# Patient Record
Sex: Male | Born: 2005 | Race: Black or African American | Hispanic: No | Marital: Single | Smoking: Never smoker
Health system: Southern US, Community
[De-identification: ages and names within clinical notes are randomized; demographics above are authoritative.]

## PROBLEM LIST (undated history)

## (undated) ENCOUNTER — Ambulatory Visit (HOSPITAL_COMMUNITY): Admission: EM | Payer: Self-pay

## (undated) DIAGNOSIS — Z8489 Family history of other specified conditions: Secondary | ICD-10-CM

---
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
## 2023-02-28 ENCOUNTER — Emergency Department (HOSPITAL_COMMUNITY): Payer: Medicaid Other

## 2023-02-28 ENCOUNTER — Emergency Department (HOSPITAL_COMMUNITY)
Admission: EM | Admit: 2023-02-28 | Discharge: 2023-02-28 | Disposition: A | Discharge status: Home / Self Care | Payer: Medicaid Other | Attending: Emergency Medicine | Admitting: Emergency Medicine

## 2023-02-28 ENCOUNTER — Encounter (HOSPITAL_COMMUNITY): Payer: Self-pay

## 2023-02-28 ENCOUNTER — Other Ambulatory Visit: Payer: Self-pay

## 2023-02-28 DIAGNOSIS — S93401A Sprain of unspecified ligament of right ankle, initial encounter: Secondary | ICD-10-CM | POA: Insufficient documentation

## 2023-02-28 DIAGNOSIS — X501XXA Overexertion from prolonged static or awkward postures, initial encounter: Secondary | ICD-10-CM | POA: Diagnosis not present

## 2023-02-28 DIAGNOSIS — Y9302 Activity, running: Secondary | ICD-10-CM | POA: Insufficient documentation

## 2023-02-28 DIAGNOSIS — S99911A Unspecified injury of right ankle, initial encounter: Secondary | ICD-10-CM | POA: Diagnosis present

## 2023-02-28 MED ORDER — IBUPROFEN 200 MG PO TABS
600.0000 mg | ORAL_TABLET | Freq: Once | ORAL | Status: AC
  Administered 2023-02-28: 600 mg via ORAL
  Filled 2023-02-28: qty 3

## 2023-02-28 NOTE — ED Triage Notes (Signed)
Pt came in after running and twisting his right ankle. Pt ankle appears swollen and he rates his pain 13/10.

## 2023-02-28 NOTE — ED Provider Notes (Signed)
[place] EMERGENCY DEPARTMENT AT [facility] Provider Note   CSN: [id number] Arrival date & time: 02/28/23  1916     History No chief complaint on file.   Gregory Hunt is a 17 y.o. male.  Patient presented to the emergency department after an ankle injury.  He reports that he was running when he rolled his right ankle.  Reports no difficulty bearing weight.  Denies any notable deformity significant injury.  No prior history of any surgeries in this area.  Has not take any medication prior to arriving in the emergency department.  HPI     Home Medications Prior to Admission medications   Medication Sig Start Date End Date Taking? Authorizing Provider  acetaminophen (TYLENOL) 500 MG tablet Take 1,000 mg by mouth every 6 (six) hours as needed.    [provider]      Allergies    Patient has no known allergies.    Review of Systems   Review of Systems  Musculoskeletal:  Positive for joint swelling.  All other systems reviewed and are negative.   Physical Exam Updated Vital Signs BP (!) 159/93 (BP Location: Left Arm)   Pulse 87   Temp 98.7 F (37.1 C) (Oral)   Resp 18   SpO2 (!) 87%  Physical Exam Vitals and nursing note reviewed.  Constitutional:      General: He is not in acute distress.    Appearance: He is well-developed.  HENT:     Head: Normocephalic and atraumatic.  Eyes:     Conjunctiva/sclera: Conjunctivae normal.  Cardiovascular:     Rate and Rhythm: Normal rate and regular rhythm.     Heart sounds: No murmur heard. Pulmonary:     Effort: Pulmonary effort is normal. No respiratory distress.     Breath sounds: Normal breath sounds.  Abdominal:     Palpations: Abdomen is soft.     Tenderness: There is no abdominal tenderness.  Musculoskeletal:        General: Swelling, tenderness and signs of injury present. No deformity. Normal range of motion.     Cervical back: Neck supple.     Comments: Past range of motion and right ankle  largely at baseline, resisted range of motion deficient due to pain  Skin:    General: Skin is warm and dry.     Capillary Refill: Capillary refill takes less than 2 seconds.  Neurological:     Mental Status: He is alert.  Psychiatric:        Mood and Affect: Mood normal.     ED Results / Procedures / Treatments   Labs (all labs ordered are listed, but only abnormal results are displayed) Labs Reviewed - No data to display  EKG None  Radiology DG Ankle Complete Right  Result Date: 02/28/2023 CLINICAL DATA:  Right ankle pain EXAM: RIGHT ANKLE - COMPLETE 3+ VIEW COMPARISON:  None Available. FINDINGS: There is soft tissue swelling of the anterior and lateral ankle. There is no acute fracture or dislocation. Joint spaces are well maintained. Small sclerotic density in the distal tibial diaphysis is favored as benign, possibly a bone island. IMPRESSION: Soft tissue swelling of the anterior and lateral ankle. No acute fracture. Electronically Signed   By: Darliss Cheney M.D.   On: 02/28/2023 20:47    Procedures Procedures   Medications Ordered in ED Medications  ibuprofen (ADVIL) tablet 600 mg (600 mg Oral Given 02/28/23 2051)    ED Course/ Medical Decision Making/ A&P  Medical Decision Making Amount and/or Complexity of Data Reviewed Radiology: ordered.  Risk OTC drugs.   This patient presents to the ED for concern of ankle injury.  Differential diagnosis includes ankle sprain, ankle fracture, ankle dislocation, trimalleolar fracture   Imaging Studies ordered:  I ordered imaging studies including right ankle x-ray I independently visualized and interpreted imaging which showed soft tissue swelling but no evidence of any acute fracture or dislocation I agree with the radiologist interpretation   Medicines ordered and prescription drug management:  I ordered medication including ibuprofen for pain Reevaluation of the patient after these medicines  showed that the patient improved I have reviewed the patients home medicines and have made adjustments as needed   Problem List / ED Course:  Patient presented to the emergency department with reports of a right ankle injury.  He reports that he was running when he rolled his ankle.  He did reports that he had some difficulty bearing weight at that time.  Denies any joint instability or any numbness in this area.  No prior history of any surgeries in this area.  Notable swelling on physical exam.  Point tenderness to the lateral malleolus.  X-ray negative for any fracture.  Given patient's history, will place patient in ASO lace up brace for ankle sprain.  Vies patient that he should take over-the-counter analgesic medication such as Tylenol, ibuprofen, Aleve for pain control.  Advised patient to follow-up with primary care provider for further evaluation to ensure that patient is healing without complications.  Patient is agreeable with treatment plan verbalized understanding all return precautions.  Final Clinical Impression(s) / ED Diagnoses Final diagnoses:  Sprain of right ankle, unspecified ligament, initial encounter    Rx / DC Orders ED Discharge Orders     None         Smitty Knudsen, PA-C 02/28/23 2107    Loetta Rough, MD 03/01/23 [phone]

## 2023-02-28 NOTE — Discharge Instructions (Signed)
You were seen in the emergency department for an ankle injury. Thankfully there was no evidence of a fracture or dislocation. You sustained an ankle sprain and an ASO lace-up brace was provided. Please use this brace for the next week for added support but then begin to discontinue to use to prevent over reliance on this brace.

## 2024-06-01 ENCOUNTER — Ambulatory Visit (HOSPITAL_COMMUNITY)

## 2024-06-23 ENCOUNTER — Ambulatory Visit
Admission: RE | Admit: 2024-06-23 | Discharge: 2024-06-23 | Disposition: A | Discharge status: Home / Self Care | Payer: Self-pay | Source: Ambulatory Visit | Attending: Physician Assistant | Admitting: Physician Assistant

## 2024-06-23 ENCOUNTER — Other Ambulatory Visit: Payer: Self-pay

## 2024-06-23 VITALS — BP 168/87 | HR 78 | Temp 99.6°F | Resp 19 | Ht 72.0 in | Wt 220.0 lb

## 2024-06-23 DIAGNOSIS — Z113 Encounter for screening for infections with a predominantly sexual mode of transmission: Secondary | ICD-10-CM

## 2024-06-23 DIAGNOSIS — R300 Dysuria: Secondary | ICD-10-CM

## 2024-06-23 DIAGNOSIS — Z202 Contact with and (suspected) exposure to infections with a predominantly sexual mode of transmission: Secondary | ICD-10-CM

## 2024-06-23 LAB — POCT URINE DIPSTICK
Bilirubin, UA: NEGATIVE
Glucose, UA: NEGATIVE mg/dL
Ketones, POC UA: NEGATIVE mg/dL
Nitrite, UA: NEGATIVE
Spec Grav, UA: 1.02 (ref 1.010–1.025)
Urobilinogen, UA: 0.2 U/dL
pH, UA: 6 (ref 5.0–8.0)

## 2024-06-23 NOTE — ED Notes (Signed)
 Informed by Gregory Hunt at patient access that the pt left the facility. Pt had been triaged and provided specimens but not seen by the provider yet. I attempted x1 to call the patient for him to come back and be seen by the provider with no answer.

## 2024-06-23 NOTE — ED Notes (Signed)
Called patient x 1, no answer.
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
## 2024-06-23 NOTE — ED Notes (Addendum)
 This RN informed by Norlene RT pt has left facility. After triage, this RN explained to patient that he would have to stay until provider assessed and discharged him. This RN attempted x 2, no answer.

## 2024-06-24 ENCOUNTER — Ambulatory Visit (HOSPITAL_COMMUNITY): Payer: Self-pay

## 2024-07-21 ENCOUNTER — Ambulatory Visit (HOSPITAL_COMMUNITY)
Admission: EM | Admit: 2024-07-21 | Discharge: 2024-07-21 | Disposition: A | Discharge status: Home / Self Care | Payer: Self-pay | Attending: Family Medicine | Admitting: Family Medicine

## 2024-07-21 ENCOUNTER — Encounter (HOSPITAL_COMMUNITY): Payer: Self-pay

## 2024-07-21 DIAGNOSIS — Z113 Encounter for screening for infections with a predominantly sexual mode of transmission: Secondary | ICD-10-CM | POA: Insufficient documentation

## 2024-07-21 LAB — HIV ANTIBODY (ROUTINE TESTING W REFLEX): HIV Screen 4th Generation wRfx: NONREACTIVE

## 2024-07-21 NOTE — ED Triage Notes (Signed)
 Pt requesting STD and blood work. Denies sx's or exposure.

## 2024-07-21 NOTE — Discharge Instructions (Signed)
 We have sent testing for sexually transmitted infections. We will notify you of any positive results once they are received. If required, we will prescribe any medications you might need.  Please refrain from all sexual activity for at least the next seven days.
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
## 2024-07-22 ENCOUNTER — Ambulatory Visit (HOSPITAL_COMMUNITY): Payer: Self-pay

## 2024-07-22 LAB — CYTOLOGY, (ORAL, ANAL, URETHRAL) ANCILLARY ONLY
Chlamydia: POSITIVE — AB
Comment: NEGATIVE
Comment: NEGATIVE
Comment: NORMAL
Neisseria Gonorrhea: NEGATIVE
Trichomonas: NEGATIVE

## 2024-07-22 LAB — RPR: RPR Ser Ql: NONREACTIVE

## 2024-07-22 MED ORDER — DOXYCYCLINE HYCLATE 100 MG PO TABS: 100.0000 mg | ORAL_TABLET | Freq: Two times a day (BID) | ORAL | 0 refills | Status: AC
